# Patient Record
Sex: Female | Born: 1955 | Race: White | Hispanic: No | Marital: Married | Smoking: Former smoker
Health system: Southern US, Community
[De-identification: ages and names within clinical notes are randomized; demographics above are authoritative.]

## PROBLEM LIST (undated history)

## (undated) DIAGNOSIS — I471 Supraventricular tachycardia, unspecified: Secondary | ICD-10-CM

## (undated) DIAGNOSIS — R918 Other nonspecific abnormal finding of lung field: Secondary | ICD-10-CM

## (undated) DIAGNOSIS — M8580 Other specified disorders of bone density and structure, unspecified site: Secondary | ICD-10-CM

## (undated) DIAGNOSIS — E039 Hypothyroidism, unspecified: Secondary | ICD-10-CM

## (undated) DIAGNOSIS — R7301 Impaired fasting glucose: Secondary | ICD-10-CM

## (undated) DIAGNOSIS — R000 Tachycardia, unspecified: Secondary | ICD-10-CM

## (undated) DIAGNOSIS — E7800 Pure hypercholesterolemia, unspecified: Secondary | ICD-10-CM

## (undated) DIAGNOSIS — E785 Hyperlipidemia, unspecified: Secondary | ICD-10-CM

## (undated) HISTORY — DX: Other nonspecific abnormal finding of lung field: R91.8

## (undated) HISTORY — DX: Other specified disorders of bone density and structure, unspecified site: M85.80

## (undated) HISTORY — DX: Hyperlipidemia, unspecified: E78.5

## (undated) HISTORY — DX: Pure hypercholesterolemia, unspecified: E78.00

## (undated) HISTORY — DX: Supraventricular tachycardia: I47.1

## (undated) HISTORY — DX: Impaired fasting glucose: R73.01

## (undated) HISTORY — DX: Supraventricular tachycardia, unspecified: I47.10

## (undated) HISTORY — DX: Tachycardia, unspecified: R00.0

## (undated) HISTORY — DX: Hypothyroidism, unspecified: E03.9

---

## 1999-05-07 ENCOUNTER — Other Ambulatory Visit: Admission: RE | Admit: 1999-05-07 | Discharge: 1999-05-07 | Payer: Self-pay

## 2001-05-23 ENCOUNTER — Other Ambulatory Visit: Admission: RE | Admit: 2001-05-23 | Discharge: 2001-05-23 | Payer: Self-pay | Admitting: Internal Medicine

## 2002-10-16 ENCOUNTER — Other Ambulatory Visit: Admission: RE | Admit: 2002-10-16 | Discharge: 2002-10-16 | Payer: Self-pay | Admitting: Internal Medicine

## 2005-03-19 ENCOUNTER — Other Ambulatory Visit: Admission: RE | Admit: 2005-03-19 | Discharge: 2005-03-19 | Payer: Self-pay | Admitting: Internal Medicine

## 2005-03-23 ENCOUNTER — Ambulatory Visit (HOSPITAL_COMMUNITY): Admission: RE | Admit: 2005-03-23 | Discharge: 2005-03-23 | Payer: Self-pay | Admitting: Internal Medicine

## 2006-10-04 ENCOUNTER — Other Ambulatory Visit: Admission: RE | Admit: 2006-10-04 | Discharge: 2006-10-04 | Payer: Self-pay | Admitting: Obstetrics and Gynecology

## 2007-12-21 ENCOUNTER — Encounter: Admission: RE | Admit: 2007-12-21 | Discharge: 2007-12-21 | Payer: Self-pay | Admitting: Obstetrics and Gynecology

## 2008-05-14 ENCOUNTER — Encounter: Admission: RE | Admit: 2008-05-14 | Discharge: 2008-05-14 | Payer: Self-pay | Admitting: Obstetrics and Gynecology

## 2008-11-11 ENCOUNTER — Other Ambulatory Visit: Admission: RE | Admit: 2008-11-11 | Discharge: 2008-11-11 | Payer: Self-pay | Admitting: Obstetrics and Gynecology

## 2009-11-26 ENCOUNTER — Encounter: Admission: RE | Admit: 2009-11-26 | Discharge: 2009-11-26 | Payer: Self-pay | Admitting: Obstetrics and Gynecology

## 2010-08-16 ENCOUNTER — Encounter: Payer: Self-pay | Admitting: Obstetrics and Gynecology

## 2011-11-16 ENCOUNTER — Encounter: Payer: Self-pay | Admitting: *Deleted

## 2012-11-23 ENCOUNTER — Ambulatory Visit (INDEPENDENT_AMBULATORY_CARE_PROVIDER_SITE_OTHER): Payer: BC Managed Care – PPO | Admitting: Cardiovascular Disease

## 2012-11-23 ENCOUNTER — Encounter: Payer: Self-pay | Admitting: Cardiovascular Disease

## 2012-11-23 VITALS — BP 125/75 | HR 76 | Ht 63.5 in | Wt 120.0 lb

## 2012-11-23 DIAGNOSIS — I7300 Raynaud's syndrome without gangrene: Secondary | ICD-10-CM

## 2012-11-23 DIAGNOSIS — R002 Palpitations: Secondary | ICD-10-CM

## 2012-11-23 DIAGNOSIS — Z8249 Family history of ischemic heart disease and other diseases of the circulatory system: Secondary | ICD-10-CM

## 2012-11-23 DIAGNOSIS — I471 Supraventricular tachycardia: Secondary | ICD-10-CM | POA: Insufficient documentation

## 2012-11-23 NOTE — Progress Notes (Signed)
     Adrienne Chambers Date of Birth  1956/01/30       [facility]    [place] [address]  [address] [place], [place]  [address]   [place], [place]  [address] [phone]     [phone]   Fax  [phone]    Fax [phone]  Problem List:   History of Present Illness:  Adrienne Chambers is doing well.   He has occasional palpitations.  She is still exercising some.  No chest pain. No dyspnea.   Current Outpatient Prescriptions on File Prior to Visit  Medication Sig Dispense Refill  . levothyroxine (SYNTHROID, LEVOTHROID) 88 MCG tablet Take 88 mcg by mouth daily.       No current facility-administered medications on file prior to visit.    Allergies  Allergen Reactions  . Sulfa Antibiotics     Past Medical History  Diagnosis Date  . Hypothyroidism   . Tachycardia     No past surgical history on file.  History  Smoking status  . Former Smoker  . Quit date: 07/26/1981  Smokeless tobacco  . Not on file    History  Alcohol Use  . 1.0 oz/week  . 2 drink(s) per week    Family History  Problem Relation Age of Onset  . Coronary artery disease    . Arrhythmia      Reviw of Systems:  Reviewed in the HPI.  All other systems are negative.  Physical Exam: Blood pressure 110/90, pulse 76, height 5' 3.5" (1.613 m), weight 120 lb (54.432 kg). General: Well developed, well nourished, in no acute distress.  Head: Normocephalic, atraumatic, sclera non-icteric, mucus membranes are moist,   Neck: Supple. Carotids are 2 + without bruits. No JVD   Lungs: Clear   Heart: RR with occasional premature beats, normal S1, S2  Abdomen: Soft, non-tender, non-distended with normal bowel sounds.  Msk:  Strength and tone are normal   Extremities: No clubbing or cyanosis. No edema.  Distal pedal pulses are 2+ and equal    Neuro: CN II - XII intact.  Alert and oriented X 3.   Psych:  Normal   ECG: May1, 2014:  Normal sinus  rhythm with occasional premature atrial contractions.  Assessment / Plan:

## 2012-11-23 NOTE — Assessment & Plan Note (Signed)
Adrienne Chambers is doing fairly well. I've seen her in the past several years ago for some palpitations. She is thought to have some premature atrial contractions.  She had several PACs on her EKG today.  Her TSH has been normal.  At this point I think that we should just follow her clinically. She has a history of Raynaud's phenomenon and I do not think that she would tolerate beta blockers. In addition, the beta blockers would potentially affect are replacement.  I reassured her that her mild palpitations are benign. We'll see her again in one year.

## 2012-11-23 NOTE — Patient Instructions (Addendum)
Your physician wants you to follow-up in: 1 year  You will receive a reminder letter in the mail two months in advance. If you don't receive a letter, please call our office to schedule the follow-up appointment.   Your physician recommends that you return for a FASTING lipid profile: 1 year   

## 2012-11-23 NOTE — Assessment & Plan Note (Signed)
She has a history of Raynaud's phenomenon which typically bothers her the winter. She seems to be stable at present.

## 2012-11-27 LAB — HEPATIC FUNCTION PANEL
AST: 29 U/L (ref 0–37)
Albumin: 4.5 g/dL (ref 3.5–5.2)
Total Protein: 7.4 g/dL (ref 6.0–8.3)

## 2012-11-27 LAB — LIPID PANEL: Total CHOL/HDL Ratio: 3

## 2012-11-27 LAB — LDL CHOLESTEROL, DIRECT: Direct LDL: 117.3 mg/dL

## 2012-11-27 LAB — BASIC METABOLIC PANEL
BUN: 19 mg/dL (ref 6–23)
Creatinine, Ser: 0.9 mg/dL (ref 0.4–1.2)

## 2013-06-29 ENCOUNTER — Telehealth: Payer: Self-pay | Admitting: Cardiovascular Disease

## 2013-06-29 NOTE — Telephone Encounter (Signed)
Patient last seen in May, 2014. States that she continues to have intermittent episodes of "racing heart", usually happens with exercise but sometimes even at rest. Episodes last several minutes. She is requesting to be proactive and have "a more thorough diagnostic workup", as soon as possible, due to family cardiac history and persistent concerns related to heart rate episodes. Routed to Dr. Garfield Cornea, RN.

## 2013-06-29 NOTE — Telephone Encounter (Signed)
New Problem:  Pt states she is wanting some cardiology tests. Pt states her father died at 28 from heart disease. Pt states her mom had open heart surgery.  Pt has been told she has an irregular heart beat and wants to know if she should have a stress test, echo, or other speciality tests.

## 2013-07-02 ENCOUNTER — Ambulatory Visit (INDEPENDENT_AMBULATORY_CARE_PROVIDER_SITE_OTHER): Payer: BC Managed Care – PPO | Admitting: Cardiovascular Disease

## 2013-07-02 ENCOUNTER — Encounter: Payer: Self-pay | Admitting: Cardiovascular Disease

## 2013-07-02 VITALS — BP 108/76 | HR 62 | Ht 63.5 in | Wt 120.0 lb

## 2013-07-02 DIAGNOSIS — R002 Palpitations: Secondary | ICD-10-CM

## 2013-07-02 NOTE — Patient Instructions (Addendum)
We did find that she had premature atrial contractions on your  EKG.  These are benign arrhythmias that come from the upper chamber of the heart. They're not serious.  Some of her palpitations may be runs of atrial tachycardia. It would be unlikely for you to have atrial fibrillation since that would last for 5-10 minutes.  Even if we did find that you had brief episodes of atrial fibrillation he risk of stroke is incredibly low because you do not have any of the associated risk factors  (congestive heart failure, hypertension, age over 79, diabetes, stroke)  Your thyroid replacement may also be part of the problem.  You should take the medicine at the same time each day and without food ( or as directed by your medical doctor)    If you have a sustained tachycardia, try the valsalva maneuver or diving reflex ( cold water bottles to the forehead)   Your physician recommends that you schedule a follow-up appointment in: 3 months in [facility]   Your physician has requested that you have an echocardiogram. Echocardiography is a painless test that uses sound waves to create images of your heart. It provides your doctor with information about the size and shape of your heart and how well your heart's chambers and valves are working. This procedure takes approximately one hour. There are no restrictions for this procedure.   30 Day Cardiac Monitor

## 2013-07-02 NOTE — Progress Notes (Signed)
    [name] Date of Birth  03-Jul-1956       [facility]    [place] [address]  [address] [place], [place]  [address]   [place], [place]  [address] [phone]     [phone]   Fax  [phone]    Fax [phone]  Problem List: 1. Premature atrial contractions 2. Hypothyroidism   History of Present Illness:  Yuko is doing well.   He has occasional palpitations.  She is still exercising some.  No chest pain. No dyspnea.  Dec. 8, 2014;  Saide is seen again for follow up of her palpitations.  She has had palpitations for years - father died of complications of A-Fib.   These palpitations are fairly rare - may occur while she is playing tennis.   The arrhythmia is usually a rapid, regular - last 15 seconds to 2 minutes.   These palpitations do not prevent her from doing her normal activities.   These episodes almost always occur in the summer. She's had several episodes while playing tennis out in the hot sun.     Current Outpatient Prescriptions on File Prior to Visit  Medication Sig Dispense Refill  . aspirin 81 MG tablet Take 81 mg by mouth daily. Take one tab daily as needed      . levothyroxine (SYNTHROID, LEVOTHROID) 88 MCG tablet Take 88 mcg by mouth daily.       No current facility-administered medications on file prior to visit.   she takes her Synthroid 88 mcg alternating with 75 mcg every other day.  Allergies  Allergen Reactions  . Sulfa Antibiotics     Past Medical History  Diagnosis Date  . Hypothyroidism   . Tachycardia     History reviewed. No pertinent past surgical history.  History  Smoking status  . Former Smoker  . Quit date: 07/26/1981  Smokeless tobacco  . Not on file    History  Alcohol Use  . 1.0 oz/week  . 2 drink(s) per week    Family History  Problem Relation Age of Onset  . Coronary artery disease    . Arrhythmia    . Heart disease Mother   . Coronary artery  disease Mother   . Arrhythmia Father   . Heart attack Father     Reviw of Systems:  Reviewed in the HPI.  All other systems are negative.  Physical Exam: Blood pressure 108/76, pulse 62, height 5' 3.5" (1.613 m), weight 120 lb (54.432 kg). General: Well developed, well nourished, in no acute distress.  Head: Normocephalic, atraumatic, sclera non-icteric, mucus membranes are moist,   Neck: Supple. Carotids are 2 + without bruits. No JVD   Lungs: Clear   Heart: RR with occasional premature beats, normal S1, S2  Abdomen: Soft, non-tender, non-distended with normal bowel sounds.  Msk:  Strength and tone are normal   Extremities: No clubbing or cyanosis. No edema.  Distal pedal pulses are 2+ and equal    Neuro: CN II - XII intact.  Alert and oriented X 3.   Psych:  Normal   ECG: May1, 2014:  Normal sinus rhythm with occasional premature atrial contractions.  Assessment / Plan:

## 2013-07-02 NOTE — Assessment & Plan Note (Addendum)
Adrienne Chambers  presents for further evaluation of her palpitations. Her palpitations have sounded rather benign over the years. They last anywhere from 30 seconds to 5-10 minutes. They definitely do not last for several hours. She occasionally has some dizziness. It's possible that she's having some atrial tachycardia or perhaps may have supraventricular tachycardia.   I've instructed her in the Valsalva maneuver.  We'll get an echocardiogram because of her palpitations and abnormal heart sounds. We'll place a 30 day event monitor. I've instructed her to try to take her Synthroid at the same time every day and without food as instructed by her medical doctor.  See her in 3 months.

## 2013-07-03 ENCOUNTER — Telehealth (INDEPENDENT_AMBULATORY_CARE_PROVIDER_SITE_OTHER): Payer: Self-pay

## 2013-07-03 NOTE — Telephone Encounter (Signed)
LMOV pt to call for appt information.  Dr. Abbey Chatters on 08/01/13 at 11:10a.m.  Please instruct the patient to arrive 30 minutes prior.  Pt referred by Dr. Eden Emms for umbilical hernia.

## 2013-07-04 ENCOUNTER — Telehealth: Payer: Self-pay | Admitting: *Deleted

## 2013-07-04 DIAGNOSIS — K429 Umbilical hernia without obstruction or gangrene: Secondary | ICD-10-CM

## 2013-07-04 NOTE — Telephone Encounter (Signed)
Message copied by Antony Odea on Wed Jul 04, 2013 11:27 AM ------      Message from: Vesta Mixer      Created: Tue Jul 03, 2013  5:44 AM       Emmit Alexanders has a small umbilical hernia and would like to see you  for evaluation.    It is a small hernia and likely is not going to cause any problems for a while but she would like to see if it should be fixed before it gets any larger.            Thanks,      Jones Broom,            Will you make a referral to Dr. Abbey Chatters in epic please.  Thank you ------

## 2013-07-04 NOTE — Telephone Encounter (Signed)
Order placed/ staff note sent to call pt with date and time.

## 2013-07-25 ENCOUNTER — Encounter: Payer: Self-pay | Admitting: Radiology

## 2013-07-25 ENCOUNTER — Encounter (INDEPENDENT_AMBULATORY_CARE_PROVIDER_SITE_OTHER): Payer: BC Managed Care – PPO

## 2013-07-25 ENCOUNTER — Other Ambulatory Visit (HOSPITAL_COMMUNITY): Payer: BC Managed Care – PPO

## 2013-07-25 ENCOUNTER — Ambulatory Visit (HOSPITAL_COMMUNITY): Payer: BC Managed Care – PPO | Attending: Cardiovascular Disease | Admitting: Radiology

## 2013-07-25 DIAGNOSIS — R002 Palpitations: Secondary | ICD-10-CM

## 2013-07-25 DIAGNOSIS — I079 Rheumatic tricuspid valve disease, unspecified: Secondary | ICD-10-CM | POA: Insufficient documentation

## 2013-07-25 DIAGNOSIS — I491 Atrial premature depolarization: Secondary | ICD-10-CM

## 2013-07-25 NOTE — Progress Notes (Signed)
Patient ID: Adrienne Chambers, female   DOB: 03-07-1956, 57 y.o.   MRN: [id number] E Cardio 30 day monitor applied

## 2013-07-25 NOTE — Progress Notes (Signed)
Echocardiogram performed.  

## 2013-08-01 ENCOUNTER — Ambulatory Visit (INDEPENDENT_AMBULATORY_CARE_PROVIDER_SITE_OTHER): Payer: BC Managed Care – PPO | Admitting: General Surgery

## 2013-08-23 ENCOUNTER — Telehealth: Payer: Self-pay | Admitting: Cardiovascular Disease

## 2013-08-23 ENCOUNTER — Telehealth: Payer: Self-pay

## 2013-08-23 NOTE — Telephone Encounter (Signed)
New problem   Pt stated she was waiting on a call from Dr Elease HashimotoNahser. Per pt the nurse stated he was going to call her and she haven't heard from him. And also need to talk to nurse concerning wearing her monitor. Please call pt.

## 2013-08-23 NOTE — Telephone Encounter (Signed)
Pt states she did not wear her monitor the whole time. States "it is an obnoxious machine, very problematic, and can't see how anyone can wear it". States a couple days ago she had a dizzy spell when she was playing tennis, states her dizzy spells only come when she is playing tennis. She thinks she may need to wear the monitor now, but not unless we have a smaller one. Please call and advise.

## 2013-08-23 NOTE — Telephone Encounter (Signed)
I sent a note to Dr Elease HashimotoNahser to call the pt as he noted on her echo report. I left pt a msg to call back regarding monitor question.

## 2013-08-27 ENCOUNTER — Ambulatory Visit (INDEPENDENT_AMBULATORY_CARE_PROVIDER_SITE_OTHER): Payer: BC Managed Care – PPO | Admitting: General Surgery

## 2013-08-27 ENCOUNTER — Encounter (INDEPENDENT_AMBULATORY_CARE_PROVIDER_SITE_OTHER): Payer: Self-pay

## 2013-08-27 ENCOUNTER — Encounter (INDEPENDENT_AMBULATORY_CARE_PROVIDER_SITE_OTHER): Payer: Self-pay | Admitting: General Surgery

## 2013-08-27 VITALS — BP 110/72 | HR 60 | Temp 98.0°F | Resp 14 | Ht 63.5 in | Wt 121.6 lb

## 2013-08-27 DIAGNOSIS — K429 Umbilical hernia without obstruction or gangrene: Secondary | ICD-10-CM

## 2013-08-27 NOTE — Patient Instructions (Signed)
Please come back and see us if the hernia becomes more uncomfortable or gets larger.

## 2013-08-27 NOTE — Progress Notes (Signed)
Patient ID: Adrienne Chambers, female   DOB: 09/18/1955, 58 y.o.   MRN: [id number]  Chief Complaint  Patient presents with  . New Evaluation    eval umb hernia    HPI Adrienne FrohlichBeverly A [place] is a 58 y.o. female.   HPI  She is referred by Dr. Kristeen MissPhilip Nahser because of an umbilical hernia. She states this has been present for about 2-3 years. She gets occasional "twinge" of discomfort. No major discomfort. It has not changed in size. She is very active.  Past Medical History  Diagnosis Date  . Hypothyroidism   . Tachycardia     History reviewed. No pertinent past surgical history.  Family History  Problem Relation Age of Onset  . Coronary artery disease    . Arrhythmia    . Heart disease Mother   . Coronary artery disease Mother   . Arrhythmia Father   . Heart attack Father     Social History History  Substance Use Topics  . Smoking status: Former Smoker    Quit date: 07/26/1981  . Smokeless tobacco: Never Used  . Alcohol Use: 1.0 oz/week    2 drink(s) per week    Allergies  Allergen Reactions  . Sulfa Antibiotics     Current Outpatient Prescriptions  Medication Sig Dispense Refill  . aspirin 81 MG tablet Take 81 mg by mouth daily. Take one tab daily as needed      . Calcium Carbonate (CALTRATE 600 PO) Take 600 mg by mouth daily.      Marland Kitchen. levothyroxine (SYNTHROID, LEVOTHROID) 75 MCG tablet Take 75 mcg by mouth every other day.      . levothyroxine (SYNTHROID, LEVOTHROID) 88 MCG tablet Take 88 mcg by mouth daily.       No current facility-administered medications for this visit.    Review of Systems Review of Systems  Constitutional: Negative.   Respiratory: Negative.   Cardiovascular: Negative.   Gastrointestinal: Negative.   Genitourinary: Negative.     Blood pressure 110/72, pulse 60, temperature 98 F (36.7 C), temperature source Oral, resp. rate 14, height 5' 3.5" (1.613 m), weight 121 lb 9.6 oz (55.157 kg).  Physical Exam Physical Exam  Constitutional: She  appears well-developed and well-nourished. No distress.  HENT:  Head: Normocephalic and atraumatic.  Abdominal: Soft. She exhibits no distension. There is no tenderness.  There is a umbilical bulge that is not completely reducible. There is minimal tenderness. No erythema.  Neurological: She is alert.  Skin: Skin is warm and dry.  Psychiatric: She has a normal mood and affect. Her behavior is normal.    Data Reviewed Dr. Harvie BridgeNahser's office visit  Assessment    Umbilical hernia that has not changed and is essentially asymptomatic. We talked about options of repair versus expectant management.     Plan    Expectant management for now. If the hernia becomes more symptomatic or larger, I recommended she return and we discuss repair.  No activity restrictions.       Pattrick Bady J 08/27/2013, 2:48 PM

## 2013-08-29 ENCOUNTER — Telehealth: Payer: Self-pay | Admitting: *Deleted

## 2013-08-29 NOTE — Telephone Encounter (Signed)
Left voicemail letting patient know that per Dr. Elease HashimotoNahser his nurse Michael LitterJodette will be giving her a call about the monitor.

## 2013-08-29 NOTE — Telephone Encounter (Signed)
Pt has had dizzy spells // should she wear heart monitor again. Plus I reviewed echo with her you stated on there you will call to discuss. Pt is very interested in preventative care/ calcium screening due to pts family hx of early heart disease. Please call her.

## 2013-08-29 NOTE — Telephone Encounter (Signed)
Left msg stating ecardio readings were normal and to call with further questions or concerns.

## 2013-08-30 NOTE — Telephone Encounter (Signed)
Dr Elease HashimotoNahser left msg with pt.

## 2013-08-31 ENCOUNTER — Other Ambulatory Visit: Payer: Self-pay | Admitting: *Deleted

## 2013-08-31 DIAGNOSIS — R002 Palpitations: Secondary | ICD-10-CM

## 2013-08-31 NOTE — Progress Notes (Unsigned)
Pt wore a heart monitor for a couple days then removed it stating it was too bulky and hard to perform her daily activities. Per Dr Elease HashimotoNahser, she is to have a Zio Patch monitor. Spoke with Florentina AddisonKatie //monitor room and she will see if we can get one for her.

## 2013-09-28 ENCOUNTER — Encounter: Payer: Self-pay | Admitting: Radiology

## 2013-09-28 ENCOUNTER — Encounter (INDEPENDENT_AMBULATORY_CARE_PROVIDER_SITE_OTHER): Payer: BC Managed Care – PPO

## 2013-09-28 DIAGNOSIS — R002 Palpitations: Secondary | ICD-10-CM

## 2013-09-28 DIAGNOSIS — R439 Unspecified disturbances of smell and taste: Secondary | ICD-10-CM

## 2013-09-28 NOTE — Progress Notes (Signed)
Patient ID: Adrienne Chambers, female   DOB: July 07, 1956, 58 y.o.   MRN: [id number] 14 Day Zio patch applied

## 2013-10-26 ENCOUNTER — Telehealth: Payer: Self-pay | Admitting: Cardiovascular Disease

## 2013-10-26 NOTE — Telephone Encounter (Signed)
Adrienne SpragueBeverly has been wearing the Zio XT patch monitor. She was found to have episodes of supraventricular tachycardia up to rates of 179. 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  We discussed this possibility on previous office visits. Her heart rate and blood pressure run low at baseline so we have not started her on any beta blockers.  She was not at home tonight when I called her. I've left a message on her phone. She's to try Valsalva maneuver. She can also try placing ice cold rag up to her for and or neck.  She'll call me next week for followup visit. We'll discuss options at that time. She may wish to have ablation of this SVT.  Adrienne MixerPhilip J. Nahser, Montez HagemanJr., MD, [facility] 10/26/2013, 6:16 PM Office - [phone] Pager [phone]

## 2013-10-29 ENCOUNTER — Telehealth: Payer: Self-pay | Admitting: *Deleted

## 2013-10-29 NOTE — Telephone Encounter (Signed)
Spoke with family member, [place]10[place]13.  I was calling to make an app for her/// ZIO XT monitor results showed SVT@179BPM . Per Dr Elease HashimotoNahser wants her to have an app as soon as can, Dr Elease HashimotoNahser left msg for her last week also. Pt to have an app when available.

## 2013-11-12 NOTE — Telephone Encounter (Signed)
Left message for patient to call office to schedule an appointment.

## 2013-11-20 NOTE — Telephone Encounter (Signed)
Left message for patient to call office regarding possible appointment tomorrow

## 2013-11-20 NOTE — Telephone Encounter (Signed)
Patient scheduled to see Dr. Elease HashimotoNahser 4/29

## 2013-11-21 ENCOUNTER — Encounter: Payer: Self-pay | Admitting: Cardiovascular Disease

## 2013-11-21 ENCOUNTER — Ambulatory Visit (INDEPENDENT_AMBULATORY_CARE_PROVIDER_SITE_OTHER): Payer: BC Managed Care – PPO | Admitting: Cardiovascular Disease

## 2013-11-21 VITALS — BP 120/75 | HR 52 | Ht 63.5 in | Wt 120.4 lb

## 2013-11-21 DIAGNOSIS — I498 Other specified cardiac arrhythmias: Secondary | ICD-10-CM

## 2013-11-21 DIAGNOSIS — I471 Supraventricular tachycardia: Secondary | ICD-10-CM

## 2013-11-21 NOTE — Assessment & Plan Note (Signed)
Adrienne Chambers patch monitor and was found to have episodes of supraventricular tachycardia. He reported one episode of SVT at 179 beats a minute and occurred at 3:30 in the morning.  She's had palpitations for the past many years. These are minimally symptomatic. She has a slight dizziness but has never had syncope or presyncope. She denies any chest pain or shortness breath.  Her heart rate is very slow at rest. We performed a 1 minutes that his peak she increase her heart rate from 60 to 92 without any difficulty.  I instructed her in the Valsalva maneuver.  WE  also talked about the diving reflex.  We discussed the fact that she may need RF ablation for SVT if she feels this conservative therapy. Her heart rate at baseline is quite low so  we cannot add a beta blocker or calcium channel blocker.

## 2013-11-21 NOTE — Patient Instructions (Addendum)
I think you have supraventricular tachycardia. This is clearly different from ventricular tachycardia.      Try the Valsalva maneuver.   Also try stimulation of the diving reflex.    Your physician recommends that you continue on your current medications as directed. Please refer to the Current Medication list given to you today.  Your physician wants you to follow-up in: 1 year with Dr. Elease HashimotoNahser.  You will receive a reminder letter in the mail two months in advance. If you don't receive a letter, please call our office to schedule the follow-up appointment.

## 2013-11-21 NOTE — Progress Notes (Signed)
Adrienne FrohlichBeverly A Chambers Date of Birth  06-27-1956       [facility]    [place] [facility] [address]  [address] [place], [place]  [id number]   [place], [place]  [id number] [phone]     [phone]   Fax  [phone]    Fax [phone]  Problem List: 1. Premature atrial contractions 2. Hypothyroidism   History of Present Illness:  Adrienne Chambers is doing well.   He has occasional palpitations.  She is still exercising some.  No chest pain. No dyspnea.  Dec. 8, 2014;  Adrienne Chambers is seen again for follow up of her palpitations.  She has had palpitations for years - father died of complications of A-Fib.   These palpitations are fairly rare - may occur while she is playing tennis.   The arrhythmia is usually a rapid, regular - last 15 seconds to 2 minutes.   These palpitations do not prevent her from doing her normal activities.   These episodes almost always occur in the summer. She's had several episodes while playing tennis out in the hot sun.    November 21, 2013:  Adrienne Chambers has been wearing a patch monitor. Her son have episodes of supraventricular tachycardia.    Current Outpatient Prescriptions on File Prior to Visit  Medication Sig Dispense Refill  . aspirin 81 MG tablet Take 81 mg by mouth daily. Take one tab daily as needed      . Calcium Carbonate (CALTRATE 600 PO) Take 600 mg by mouth daily.      Marland Kitchen. levothyroxine (SYNTHROID, LEVOTHROID) 75 MCG tablet Take 75 mcg by mouth every other day.      . levothyroxine (SYNTHROID, LEVOTHROID) 88 MCG tablet Take 88 mcg by mouth daily.       No current facility-administered medications on file prior to visit.   she takes her Synthroid 88 mcg alternating with 75 mcg every other day.  Allergies  Allergen Reactions  . Sulfa Antibiotics     Past Medical History  Diagnosis Date  . Hypothyroidism   . Tachycardia     No past surgical history on file.  History  Smoking status  . Former Smoker  .  Quit date: 07/26/1981  Smokeless tobacco  . Never Used    History  Alcohol Use  . 1.0 oz/week  . 2 drink(s) per week    Family History  Problem Relation Age of Onset  . Coronary artery disease    . Arrhythmia    . Heart disease Mother   . Coronary artery disease Mother   . Arrhythmia Father   . Heart attack Father     Reviw of Systems:  Reviewed in the HPI.  All other systems are negative.  Physical Exam: Blood pressure 120/75, pulse 52, height 5' 3.5" (1.613 m), weight 120 lb 6.4 oz (54.613 kg). General: Well developed, well nourished, in no acute distress.  Head: Normocephalic, atraumatic, sclera non-icteric, mucus membranes are moist,   Neck: Supple. Carotids are 2 + without bruits. No JVD   Lungs: Clear   Heart: RR with occasional premature beats, normal S1, S2  Abdomen: Soft, non-tender, non-distended with normal bowel sounds.  Msk:  Strength and tone are normal   Extremities: No clubbing or cyanosis. No edema.  Distal pedal pulses are 2+ and equal    Neuro: CN II - XII intact.  Alert and oriented X 3.   Psych:  Normal   ECG: May1,  2014:  Normal sinus rhythm with occasional premature atrial contractions.  Assessment / Plan:

## 2013-12-14 ENCOUNTER — Telehealth: Payer: Self-pay | Admitting: Cardiovascular Disease

## 2013-12-14 DIAGNOSIS — Z79899 Other long term (current) drug therapy: Secondary | ICD-10-CM

## 2013-12-14 DIAGNOSIS — E7800 Pure hypercholesterolemia, unspecified: Secondary | ICD-10-CM

## 2013-12-14 DIAGNOSIS — I471 Supraventricular tachycardia: Secondary | ICD-10-CM

## 2013-12-14 NOTE — Telephone Encounter (Signed)
Pt is having fasting labs on 12/19/13 she would like to add to that vitamin D, because she is having an procedure done  at her GYN and the GYN have asked to have vit D check. Vit D was added. Pt is aware.

## 2013-12-14 NOTE — Telephone Encounter (Signed)
New problem   Pt want to add Vitamin D to her lab order.

## 2013-12-19 ENCOUNTER — Other Ambulatory Visit: Payer: BC Managed Care – PPO

## 2013-12-26 ENCOUNTER — Ambulatory Visit: Payer: BC Managed Care – PPO | Admitting: Cardiovascular Disease

## 2013-12-26 ENCOUNTER — Other Ambulatory Visit: Payer: BC Managed Care – PPO

## 2015-05-28 ENCOUNTER — Encounter: Payer: Self-pay | Admitting: *Deleted

## 2015-06-02 ENCOUNTER — Ambulatory Visit (INDEPENDENT_AMBULATORY_CARE_PROVIDER_SITE_OTHER): Payer: BLUE CROSS/BLUE SHIELD | Admitting: Cardiovascular Disease

## 2015-06-02 ENCOUNTER — Encounter: Payer: Self-pay | Admitting: Cardiovascular Disease

## 2015-06-02 VITALS — BP 124/70 | HR 62 | Ht 63.5 in | Wt 120.4 lb

## 2015-06-02 DIAGNOSIS — I471 Supraventricular tachycardia, unspecified: Secondary | ICD-10-CM

## 2015-06-02 DIAGNOSIS — E785 Hyperlipidemia, unspecified: Secondary | ICD-10-CM | POA: Diagnosis not present

## 2015-06-02 DIAGNOSIS — Z139 Encounter for screening, unspecified: Secondary | ICD-10-CM | POA: Diagnosis not present

## 2015-06-02 NOTE — Progress Notes (Signed)
Lelon FrohlichBeverly A Veras Date of Birth  25-Oct-1955       [facility]    [place] [facility] [address], [address]  [address], [address] [place], [place]  [address]   [place], [place]  [address] [phone]     [phone]   Fax  [phone]    Fax [phone]  Problem List: 1. Premature atrial contractions 2. Hypothyroidism   History of Present Illness:  Adrienne Chambers is doing well.   He has occasional palpitations.  She is still exercising some.  No chest pain. No dyspnea.  Dec. 8, 2014;  Adrienne Chambers is seen again for follow up of her palpitations.  She has had palpitations for years - father died of complications of A-Fib.   These palpitations are fairly rare - may occur while she is playing tennis.   The arrhythmia is usually a rapid, regular - last 15 seconds to 2 minutes.   These palpitations do not prevent her from doing her normal activities.   These episodes almost always occur in the summer. She's had several episodes while playing tennis out in the hot sun.    November 21, 2013:  Adrienne Chambers has been wearing a patch monitor. Her son have episodes of supraventricular tachycardia.    Nov. 7, 2016:  Adrienne Chambers is doing ok Has had some dizziness while playing tennis. At other times, she feels well.   Dizziness lasts for about 10 minutes.   No significant palpitations .   Has resolved since her mother passed away .  Has had documented SVT lasting for about a minute.   Current Outpatient Prescriptions on File Prior to Visit  Medication Sig Dispense Refill  . aspirin 81 MG tablet Take 81 mg by mouth daily. Take one tab daily as needed    . Calcium Carbonate (CALTRATE 600 PO) Take 600 mg by mouth daily.    Marland Kitchen. levothyroxine (SYNTHROID, LEVOTHROID) 88 MCG tablet Take 88 mcg by mouth daily.     No current facility-administered medications on file prior to visit.   she takes her Synthroid 88 mcg alternating with 75 mcg every other day.  Allergies  Allergen Reactions  .  Sulfa Antibiotics     Past Medical History  Diagnosis Date  . Hypothyroidism   . Tachycardia     No past surgical history on file.  History  Smoking status  . Former Smoker  . Quit date: 07/26/1981  Smokeless tobacco  . Never Used    History  Alcohol Use  . 1.0 oz/week  . 2 drink(s) per week    Family History  Problem Relation Age of Onset  . Heart disease Mother     valve repair  . Coronary artery disease Mother     CABG  . Arrhythmia Father   . Heart attack Father   . Atrial fibrillation Father   . Heart murmur Mother     Reviw of Systems:  Reviewed in the HPI.  All other systems are negative.  Physical Exam: Blood pressure 124/70, pulse 62, height 5' 3.5" (1.613 m), weight 120 lb 6.4 oz (54.613 kg). General: Well developed, well nourished, in no acute distress.  Head: Normocephalic, atraumatic, sclera non-icteric, mucus membranes are moist,   Neck: Supple. Carotids are 2 + without bruits. No JVD   Lungs: Clear   Heart: RR with occasional premature beats, normal S1, S2  Abdomen: Soft, non-tender, non-distended with normal bowel sounds.  Msk:  Strength and tone are normal  Extremities: No clubbing or cyanosis. No edema.  Distal pedal pulses are 2+ and equal    Neuro: CN II - XII intact.  Alert and oriented X 3.   Psych:  Normal   ECG: Nov. 7, 2016:  Sinus rhythm at 62 , PACs.    Assessment / Plan:     1. Supraventricular tachycardia: She's continues to have symptoms of palpitations that last about 5 minutes. It's likely that she has SVT. We demonstrated SVT on her several years ago.  At this point the episodes do not seem to bother her too much and her resting heart rate is fairly slow. I do not think that she'll tolerate a beta blocker Berry well. We'll continue to observe. We discussed doing an RF ablation if the SVT becomes a problem.   2. Family history of coronary artery disease: her father died at age 57 of sudden cardiac death in her  mother had a history of coronary artery bypass grafting in her 57s. Alyssa has a history of moderate hyperlipidemia. We'll get a coronary calcium score for further risk assessment.   3. Hyperlipidemia: She's currently watching her diet and she also is taking Red Yeast Rice. She'll be Getting her lipids checked in December. She'll forward  these results to me.   I'll see her again in one year.  Nahser, Deloris Ping, MD  06/02/2015 5:07 PM    [facility] [address] [place],  [address] [place], [place]  [address] Pager [phone] Phone: [phone]; Fax: [phone]   [facility]  [address] [place], [place]  [address] [phone]   Fax [phone]

## 2015-06-02 NOTE — Patient Instructions (Addendum)
Medication Instructions:  Your physician recommends that you continue on your current medications as directed. Please refer to the Current Medication list given to you today.   Labwork: None ordered  Testing/Procedures: Your physician recommends your have a Coronary Calcium Score  Follow-Up: Your physician wants you to follow-up in: 1 year with Dr.Norbert Malkin You will receive a reminder letter in the mail two months in advance. If you don't receive a letter, please call our office to schedule the follow-up appointment.   Any Other Special Instructions Will Be Listed Below (If Applicable). Increase your intake of fluids (water with electrolyte tabs like Nun tablets, or gatorade) , protein ( hard boiled eggs, chicken, fish) , and a electrolytes ( V-8 juice, salt, potassium chloride  which is sold as No-Salt      If you need a refill on your cardiac medications before your next appointment, please call your pharmacy.

## 2015-06-12 ENCOUNTER — Ambulatory Visit (INDEPENDENT_AMBULATORY_CARE_PROVIDER_SITE_OTHER)
Admission: RE | Admit: 2015-06-12 | Discharge: 2015-06-12 | Disposition: A | Discharge status: Home / Self Care | Payer: Self-pay | Source: Ambulatory Visit | Attending: Cardiovascular Disease | Admitting: Cardiovascular Disease

## 2015-06-12 ENCOUNTER — Telehealth: Payer: Self-pay

## 2015-06-12 DIAGNOSIS — Z139 Encounter for screening, unspecified: Secondary | ICD-10-CM

## 2015-06-12 NOTE — Telephone Encounter (Signed)
[facility] calling about a CT score result -showing 5 mm left lower lobe pulmonary nodule. They will be sending a fax over to our office of the results. Will forward to Dr. Elease HashimotoNahser and Marcelino DusterMichelle, his nurse.

## 2015-06-13 ENCOUNTER — Telehealth: Payer: Self-pay | Admitting: Nurse Practitioner

## 2015-06-13 NOTE — Telephone Encounter (Addendum)
Cassandra with [facility] calling about a CT score result -showing 5 mm left lower lobe pulmonary nodule. They will be sending a fax over to our office of the results. Will forward to Dr. Elease HashimotoNahser and Marcelino DusterMichelle, his nurse  Dr. Elease HashimotoNahser called and left message for patient to call the office to discuss results

## 2015-06-17 NOTE — Telephone Encounter (Signed)
Spoke with patient to make certain she received Dr. Harvie BridgeNahser's message and did not have any further questions.  I answered her questions about SVT, calcium score and hyperlipidemia. I advised patient that one year follow-up is recommended unless she has questions or concerns prior to that time.  She verbalized understanding and agreement.

## 2015-08-19 LAB — TSH: TSH: 0.26 u[IU]/mL — AB (ref 0.41–5.90)

## 2015-09-08 ENCOUNTER — Encounter: Payer: Self-pay | Admitting: Internal Medicine

## 2015-09-16 ENCOUNTER — Encounter: Payer: Self-pay | Admitting: Family Medicine

## 2015-09-16 ENCOUNTER — Other Ambulatory Visit (INDEPENDENT_AMBULATORY_CARE_PROVIDER_SITE_OTHER): Payer: BLUE CROSS/BLUE SHIELD

## 2015-09-16 ENCOUNTER — Ambulatory Visit (INDEPENDENT_AMBULATORY_CARE_PROVIDER_SITE_OTHER): Payer: BLUE CROSS/BLUE SHIELD | Admitting: Family Medicine

## 2015-09-16 VITALS — BP 98/72 | HR 68 | Ht 63.5 in | Wt 117.0 lb

## 2015-09-16 DIAGNOSIS — M25512 Pain in left shoulder: Secondary | ICD-10-CM | POA: Diagnosis not present

## 2015-09-16 DIAGNOSIS — M25511 Pain in right shoulder: Secondary | ICD-10-CM

## 2015-09-16 DIAGNOSIS — M7551 Bursitis of right shoulder: Secondary | ICD-10-CM | POA: Diagnosis not present

## 2015-09-16 NOTE — Progress Notes (Signed)
[facility] [address] [place], [place] [address] Phone: [phone] Subjective:    I'm seeing this patient by the request  of:  Irving Copas, MD   CC: right greater than left shoulder pain  [name] [name] is a 60 y.o. female coming in with complaint of recurrent left shoulder pain. Patient states that going on for multiple months. Seems to be increasing slowly. Patient does not remember any true injury. Tries to be active. Patient tries to play tennis fairly regularly. Does not seem to give her too much trouble with it. Denies any radiation down the hand. States that it's more of a dull throbbing aching pain. Has responded somewhat to a topical anti-inflammatory she got from her husband. Rates the severity of pain a 6 out of 10. Not stopping her from daily activities and denies any loss of range of motion. No weakness.     Past Medical History  Diagnosis Date  . Hypothyroidism   . Tachycardia   . Supraventricular tachycardia (HCC)   . Hyperlipidemia   . Hypercholesterolemia   . Osteopenia   . Abnormal CT scan, lung   . Impaired fasting glucose    No past surgical history on file. Social History   Social History  . Marital Status: Married    Spouse Name: N/A  . Number of Children: 3  . Years of Education: N/A   Occupational History  . interior design    Social History Main Topics  . Smoking status: Former Smoker    Quit date: 07/26/1981  . Smokeless tobacco: Never Used  . Alcohol Use: 1.0 oz/week    2 drink(s) per week  . Drug Use: None  . Sexual Activity: Not Asked   Other Topics Concern  . None   Social History Narrative   Allergies  Allergen Reactions  . Sulfa Antibiotics    Family History  Problem Relation Age of Onset  . Heart disease Mother     valve repair  . Coronary artery disease Mother     CABG  . Heart murmur Mother   . Arrhythmia Father   . Heart attack Father   . Atrial  fibrillation Father   . Heart disease Father     Past medical history, social, surgical and family history all reviewed in electronic medical record.  No pertanent information unless stated regarding to the chief complaint.   Review of Systems: No headache, visual changes, nausea, vomiting, diarrhea, constipation, dizziness, abdominal pain, skin rash, fevers, chills, night sweats, weight loss, swollen lymph nodes, body aches, joint swelling, muscle aches, chest pain, shortness of breath, mood changes.   Objective Blood pressure 98/72, pulse 68, height 5' 3.5" (1.613 m), weight 117 lb (53.071 kg), SpO2 97 %.  General: No apparent distress alert and oriented x3 mood and affect normal, dressed appropriately.  HEENT: Pupils equal, extraocular movements intact  Respiratory: Patient's speak in full sentences and does not appear short of breath  Cardiovascular: No lower extremity edema, non tender, no erythema  Skin: Warm dry intact with no signs of infection or rash on extremities or on axial skeleton.  Abdomen: Soft nontender  Neuro: Cranial nerves II through XII are intact, neurovascularly intact in all extremities with 2+ DTRs and 2+ pulses.  Lymph: No lymphadenopathy of posterior or anterior cervical chain or axillae bilaterally.  Gait normal with good balance and coordination.  MSK:  Non tender with full range of motion and good stability and symmetric strength  and tone of , elbows, wrist, hip, knee and ankles bilaterally.   Neck: Inspection unremarkable. No palpable stepoffs. Negative Spurling's maneuver. Full neck range of motion Grip strength and sensation normal in bilateral hands Strength good C4 to T1 distribution No sensory change to C4 to T1 Negative Hoffman sign bilaterally Reflexes normal  Shoulder: Right Inspection reveals no abnormalities, atrophy or asymmetry. Palpation is normal with no tenderness over AC joint or bicipital groove. ROM is full in all planes  passively. Rotator cuff strength normal throughout. signs of impingement with positive Neer and Hawkin's tests, but negative empty can sign. Speeds and Yergason's tests normal. No labral pathology noted with negative Obrien's, negative clunk and good stability. Normal scapular function observed. No painful arc and no drop arm sign. No apprehension sign Contralateral knee has some mild impingement.  MSK US performed of: Right This study was ordered, performed, and interpreted by Terrilee Files D.O.  Shoulder:   Supraspinatus:  Appears normal on long and transverse views, Bursal bulge seen with shoulder abduction on impingement view. Infraspinatus:  Appears normal on long and transverse views. Significant increase in Doppler flow Subscapularis:  Appears normal on long and transverse views. Positive bursa Teres Minor:  Appears normal on long and transverse views. AC joint:  Capsule undistended, no geyser sign. Glenohumeral Joint:  Appears normal without effusion. Glenoid Labrum:  Intact without visualized tears. Biceps Tendon:  Appears normal on long and transverse views, no fraying of tendon, tendon located in intertubercular groove, no subluxation with shoulder internal or external rotation.  Impression: Subacromial bursitis   Procedure note 97110; 15 minutes spent for Therapeutic exercises as stated in above notes.  This included exercises focusing on stretching, strengthening, with significant focus on eccentric aspects.  Basic scapular stabilization to include adduction and depression of scapula Scaption, focusing on proper movement and good control Internal and External rotation utilizing a theraband, with elbow tucked at side entire time Rows with theraband Proper technique shown and discussed handout in great detail with ATC.  All questions were discussed and answered.     Impression and Recommendations:     This case required medical decision making of moderate  complexity.

## 2015-09-16 NOTE — Assessment & Plan Note (Signed)
Discussed with patient at length. Patient does not have any weakness in the ultrasound today no signs of a rotator cuff tear. Seems to be all subacromial bursitis. No signs of radicular symptoms. Patient given home exercises, prescription for topical anti-inflammatories, we discussed over-the-counter medications. Patient will try these different changes and come back and see me again in 3-4 weeks. If worsening symptoms we'll consider injection.

## 2015-09-16 NOTE — Patient Instructions (Signed)
Good to see you.  Ice 20 minutes 2 times daily. Usually after activity and before bed. Exercises 3 times a week.  pennsaid pinkie amount topically 2 times daily as needed.  Duexis 3 times daily for 3 days  Continue the turmeric at  twice daily Tart cherry extract at night Work on keeping shoulder blades back during the day  See me again in 4 weeks and if not better we will consider injection.

## 2015-09-16 NOTE — Progress Notes (Signed)
Pre visit review using our clinic review tool, if applicable. No additional management support is needed unless otherwise documented below in the visit note. 

## 2015-09-23 ENCOUNTER — Other Ambulatory Visit: Payer: Self-pay

## 2015-09-23 ENCOUNTER — Telehealth: Payer: Self-pay | Admitting: Family Medicine

## 2015-09-23 DIAGNOSIS — M25511 Pain in right shoulder: Secondary | ICD-10-CM

## 2015-09-23 DIAGNOSIS — M25512 Pain in left shoulder: Principal | ICD-10-CM

## 2015-09-23 MED ORDER — DICLOFENAC SODIUM 2 % TD SOLN: TRANSDERMAL | Status: AC

## 2015-09-23 NOTE — Telephone Encounter (Signed)
States she was suppose to get a script from Dr. Katrinka Blazing for Pennsaid but has not received it yet.

## 2015-09-25 ENCOUNTER — Ambulatory Visit: Payer: BLUE CROSS/BLUE SHIELD | Admitting: Internal Medicine

## 2015-10-09 ENCOUNTER — Ambulatory Visit: Payer: BLUE CROSS/BLUE SHIELD | Admitting: Family Medicine

## 2015-12-31 DIAGNOSIS — Z79899 Other long term (current) drug therapy: Secondary | ICD-10-CM | POA: Diagnosis not present

## 2015-12-31 DIAGNOSIS — M25511 Pain in right shoulder: Secondary | ICD-10-CM | POA: Diagnosis not present

## 2015-12-31 DIAGNOSIS — E785 Hyperlipidemia, unspecified: Secondary | ICD-10-CM | POA: Diagnosis not present

## 2016-01-02 DIAGNOSIS — Z79899 Other long term (current) drug therapy: Secondary | ICD-10-CM | POA: Diagnosis not present

## 2016-01-02 DIAGNOSIS — E039 Hypothyroidism, unspecified: Secondary | ICD-10-CM | POA: Diagnosis not present

## 2016-01-02 DIAGNOSIS — E785 Hyperlipidemia, unspecified: Secondary | ICD-10-CM | POA: Diagnosis not present

## 2016-01-07 DIAGNOSIS — M542 Cervicalgia: Secondary | ICD-10-CM | POA: Diagnosis not present

## 2016-01-12 DIAGNOSIS — M47892 Other spondylosis, cervical region: Secondary | ICD-10-CM | POA: Diagnosis not present

## 2016-01-12 DIAGNOSIS — M542 Cervicalgia: Secondary | ICD-10-CM | POA: Diagnosis not present

## 2016-01-14 DIAGNOSIS — M542 Cervicalgia: Secondary | ICD-10-CM | POA: Diagnosis not present

## 2016-01-14 DIAGNOSIS — M47892 Other spondylosis, cervical region: Secondary | ICD-10-CM | POA: Diagnosis not present

## 2016-01-22 DIAGNOSIS — M47892 Other spondylosis, cervical region: Secondary | ICD-10-CM | POA: Diagnosis not present

## 2016-01-22 DIAGNOSIS — M542 Cervicalgia: Secondary | ICD-10-CM | POA: Diagnosis not present

## 2016-01-29 DIAGNOSIS — M47892 Other spondylosis, cervical region: Secondary | ICD-10-CM | POA: Diagnosis not present

## 2016-01-29 DIAGNOSIS — M542 Cervicalgia: Secondary | ICD-10-CM | POA: Diagnosis not present

## 2016-02-02 DIAGNOSIS — M47892 Other spondylosis, cervical region: Secondary | ICD-10-CM | POA: Diagnosis not present

## 2016-02-02 DIAGNOSIS — M542 Cervicalgia: Secondary | ICD-10-CM | POA: Diagnosis not present

## 2016-02-06 DIAGNOSIS — M47892 Other spondylosis, cervical region: Secondary | ICD-10-CM | POA: Diagnosis not present

## 2016-02-06 DIAGNOSIS — M542 Cervicalgia: Secondary | ICD-10-CM | POA: Diagnosis not present

## 2016-02-09 DIAGNOSIS — M542 Cervicalgia: Secondary | ICD-10-CM | POA: Diagnosis not present

## 2016-02-09 DIAGNOSIS — M47892 Other spondylosis, cervical region: Secondary | ICD-10-CM | POA: Diagnosis not present

## 2016-02-12 DIAGNOSIS — M542 Cervicalgia: Secondary | ICD-10-CM | POA: Diagnosis not present

## 2016-02-12 DIAGNOSIS — M47892 Other spondylosis, cervical region: Secondary | ICD-10-CM | POA: Diagnosis not present

## 2016-06-07 ENCOUNTER — Encounter: Payer: Self-pay | Admitting: Cardiovascular Disease

## 2016-06-07 ENCOUNTER — Encounter (INDEPENDENT_AMBULATORY_CARE_PROVIDER_SITE_OTHER): Payer: Self-pay

## 2016-06-07 ENCOUNTER — Ambulatory Visit (INDEPENDENT_AMBULATORY_CARE_PROVIDER_SITE_OTHER): Payer: BLUE CROSS/BLUE SHIELD | Admitting: Cardiovascular Disease

## 2016-06-07 VITALS — BP 110/74 | HR 56 | Ht 63.5 in | Wt 121.4 lb

## 2016-06-07 DIAGNOSIS — E785 Hyperlipidemia, unspecified: Secondary | ICD-10-CM | POA: Diagnosis not present

## 2016-06-07 DIAGNOSIS — R002 Palpitations: Secondary | ICD-10-CM | POA: Diagnosis not present

## 2016-06-07 DIAGNOSIS — Z23 Encounter for immunization: Secondary | ICD-10-CM

## 2016-06-07 DIAGNOSIS — I471 Supraventricular tachycardia: Secondary | ICD-10-CM | POA: Diagnosis not present

## 2016-06-07 LAB — HEPATIC FUNCTION PANEL
ALK PHOS: 69 U/L (ref 33–130)
ALT: 14 U/L (ref 6–29)
AST: 18 U/L (ref 10–35)
Albumin: 4.3 g/dL (ref 3.6–5.1)
BILIRUBIN DIRECT: 0.1 mg/dL (ref <=0.2)
BILIRUBIN TOTAL: 0.4 mg/dL (ref 0.2–1.2)
Indirect Bilirubin: 0.3 mg/dL (ref 0.2–1.2)
Total Protein: 6.7 g/dL (ref 6.1–8.1)

## 2016-06-07 LAB — BASIC METABOLIC PANEL
BUN: 20 mg/dL (ref 7–25)
CALCIUM: 9.8 mg/dL (ref 8.6–10.4)
CO2: 31 mmol/L (ref 20–31)
CREATININE: 0.85 mg/dL (ref 0.50–1.05)
Chloride: 102 mmol/L (ref 98–110)
GLUCOSE: 82 mg/dL (ref 65–99)
Potassium: 4.4 mmol/L (ref 3.5–5.3)
Sodium: 139 mmol/L (ref 135–146)

## 2016-06-07 LAB — LIPID PANEL
CHOL/HDL RATIO: 2.3 ratio (ref <5.0)
CHOLESTEROL: 188 mg/dL (ref <200)
HDL: 82 mg/dL (ref >50)
LDL Cholesterol: 84 mg/dL (ref <100)
Triglycerides: 112 mg/dL (ref <150)
VLDL: 22 mg/dL (ref <30)

## 2016-06-07 LAB — TSH: TSH: 7.09 mIU/L — ABNORMAL HIGH

## 2016-06-07 NOTE — Patient Instructions (Signed)
Your physician recommends that you continue on your current medications as directed. Please refer to the Current Medication list given to you today.  Your physician recommends that you return for lab work in: today (BMET, LIPIDS, LIVER FUNCTION, TSH)  Your physician wants you to follow-up in: 1 YEAR WITH DR. Elease HashimotoNAHSER.  You will receive a reminder letter in the mail two months in advance. If you don't receive a letter, please call our office to schedule the follow-up appointment.

## 2016-06-07 NOTE — Progress Notes (Signed)
Adrienne FrohlichBeverly A Chambers Date of Birth  [birth year]       [facility]    [place] [facility] [address]  [address] [place], [place]  [address]   [place], [place]  [address] [phone]     [phone]   Fax  [phone]    Fax [phone]  Problem List: 1. Premature atrial contractions 2. Hypothyroidism   History of Present Illness:  Adrienne Chambers is doing well.   He has occasional palpitations.  She is still exercising some.  No chest pain. No dyspnea.  Dec. 8, 2014;  Adrienne Chambers is seen again for follow up of her palpitations.  She has had palpitations for years - father died of complications of A-Fib.   These palpitations are fairly rare - may occur while she is playing tennis.   The arrhythmia is usually a rapid, regular - last 15 seconds to 2 minutes.   These palpitations do not prevent her from doing her normal activities.   These episodes almost always occur in the summer. She's had several episodes while playing tennis out in the hot sun.    November 21, 2013:  Adrienne Chambers has been wearing a patch monitor. Her son have episodes of supraventricular tachycardia.    Nov. 7, 2016:  Adrienne Chambers is doing ok Has had some dizziness while playing tennis. At other times, she feels well.   Dizziness lasts for about 10 minutes.   No significant palpitations .   Has resolved since her mother passed away .  Has had documented SVT lasting for about a minute.   Nov. 13, 2017:  [name] Has rare episodes of palpitations.   Typically after working out .  Still playing tennis  Lots of stress,  Husband Adrienne Chambers is selling his [employer] business.     Current Outpatient Prescriptions on File Prior to Visit  Medication Sig Dispense Refill  . aspirin 81 MG tablet Take 81 mg by mouth daily. Take one tab daily as needed    . Calcium Carbonate (CALTRATE 600 PO) Take 600 mg by mouth daily.    . Coenzyme Q10-Red Yeast Rice 60-600 MG CAPS Take 1 capsule by mouth daily.      . Diclofenac Sodium 2 % SOLN Apply twice daily to affected area 1 Bottle 2  . levothyroxine (SYNTHROID, LEVOTHROID) 88 MCG tablet Take 88 mcg by mouth daily.    . Turmeric 500 MG CAPS Take 1 capsule by mouth daily.     No current facility-administered medications on file prior to visit.    she takes her Synthroid 88 mcg alternating with 75 mcg every other day.  Allergies  Allergen Reactions  . Sulfa Antibiotics     Past Medical History:  Diagnosis Date  . Abnormal CT scan, lung   . Hypercholesterolemia   . Hyperlipidemia   . Hypothyroidism   . Impaired fasting glucose   . Osteopenia   . Supraventricular tachycardia (HCC)   . Tachycardia     History reviewed. No pertinent surgical history.  History  Smoking Status  . Former Smoker  . Quit date: 07/26/1981  Smokeless Tobacco  . Never Used    History  Alcohol Use  . 1.0 oz/week  . 2 drink(s) per week    Family History  Problem Relation Age of Onset  . Heart disease Mother     valve repair  . Coronary artery disease Mother     CABG  . Heart murmur Mother   .  Arrhythmia Father   . Heart attack Father   . Atrial fibrillation Father   . Heart disease Father     Reviw of Systems:  Reviewed in the HPI.  All other systems are negative.  Physical Exam: Blood pressure 110/74, pulse (!) 56, height 5' 3.5" (1.613 m), weight 121 lb 6.4 oz (55.1 kg), SpO2 92 %. General: Well developed, well nourished, in no acute distress.  Head: Normocephalic, atraumatic, sclera non-icteric, mucus membranes are moist,   Neck: Supple. Carotids are 2 + without bruits. No JVD   Lungs: Clear   Heart: RR with occasional premature beats, normal S1, S2  Abdomen: Soft, non-tender, non-distended with normal bowel sounds.  Msk:  Strength and tone are normal   Extremities: No clubbing or cyanosis. No edema.  Distal pedal pulses are 2+ and equal    Neuro: CN II - XII intact.  Alert and oriented X 3.   Psych:  Normal   ECG: Nov.  13, 2017:   Sinus brady at 54.   Otherwise normal .    Assessment / Plan:     1. Supraventricular tachycardia: She's continues to have symptoms of palpitations that last about 5 minutes. These have been rare frequently . Will continue to watch . Briefly discussed the Kardia Monitor - she does not want to do at this point      2. Family history of coronary artery disease: her father died at age 58 of sudden cardiac death in her mother had a history of coronary artery bypass grafting in her 10760s. Adrienne Chambers has a history of moderate hyperlipidemia.   Her coronary calcium score is 1 Will check lipids today    3. Hyperlipidemia: She's currently watching her diet and she also is taking Red Yeast Rice.     I'll see her again in one year.  Kristeen MissPhilip Dilraj Killgore, MD  06/07/2016 11:38 AM    [facility] [address],  [address] [place], [place]  [address] Pager [phone] Phone: [phone]; Fax: [phone]

## 2016-06-22 ENCOUNTER — Other Ambulatory Visit: Payer: Self-pay | Admitting: Obstetrics and Gynecology

## 2016-06-22 DIAGNOSIS — Z1231 Encounter for screening mammogram for malignant neoplasm of breast: Secondary | ICD-10-CM

## 2016-06-23 ENCOUNTER — Ambulatory Visit
Admission: RE | Admit: 2016-06-23 | Discharge: 2016-06-23 | Disposition: A | Discharge status: Home / Self Care | Payer: BLUE CROSS/BLUE SHIELD | Source: Ambulatory Visit | Attending: Obstetrics and Gynecology | Admitting: Obstetrics and Gynecology

## 2016-06-23 ENCOUNTER — Ambulatory Visit: Payer: BLUE CROSS/BLUE SHIELD

## 2016-06-23 DIAGNOSIS — Z1231 Encounter for screening mammogram for malignant neoplasm of breast: Secondary | ICD-10-CM

## 2016-07-08 DIAGNOSIS — N390 Urinary tract infection, site not specified: Secondary | ICD-10-CM | POA: Diagnosis not present

## 2016-07-08 DIAGNOSIS — Z01419 Encounter for gynecological examination (general) (routine) without abnormal findings: Secondary | ICD-10-CM | POA: Diagnosis not present

## 2016-07-08 DIAGNOSIS — Z6821 Body mass index (BMI) 21.0-21.9, adult: Secondary | ICD-10-CM | POA: Diagnosis not present

## 2016-07-08 DIAGNOSIS — N952 Postmenopausal atrophic vaginitis: Secondary | ICD-10-CM | POA: Diagnosis not present

## 2016-08-05 DIAGNOSIS — H9201 Otalgia, right ear: Secondary | ICD-10-CM | POA: Diagnosis not present

## 2016-08-05 DIAGNOSIS — E039 Hypothyroidism, unspecified: Secondary | ICD-10-CM | POA: Diagnosis not present

## 2016-08-15 IMAGING — CT CT HEART SCORING
1 of 3 series · 10 of 20 positions shown, 13 images · non-contrast
Comparison: None.

CLINICAL DATA: Risk stratification

EXAM:
Coronary Calcium Score
TECHNIQUE: The patient was scanned on a Siemens Sensation 16 slice scanner.
Axial non-contrast 3mm slices were carried out through the heart.
The data set was analyzed on a dedicated work station and scored
using the Agatson method.

[Series 6: st thins for reformat · axial · 0.60mm/px · z∈[-208,-96]mm · 10 of 138 slices shown, 13 images]
[im 13/138  vessel]
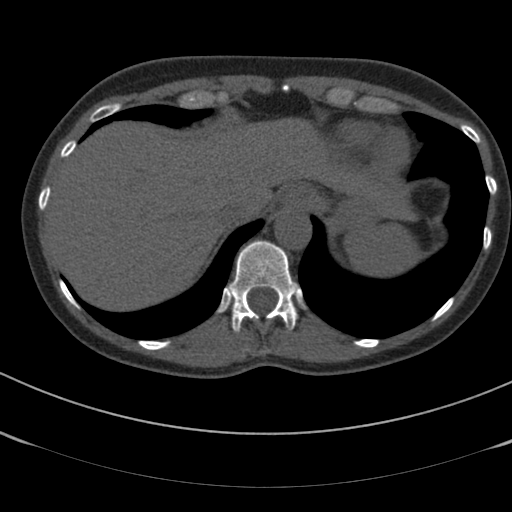
[im 13/138  lung]
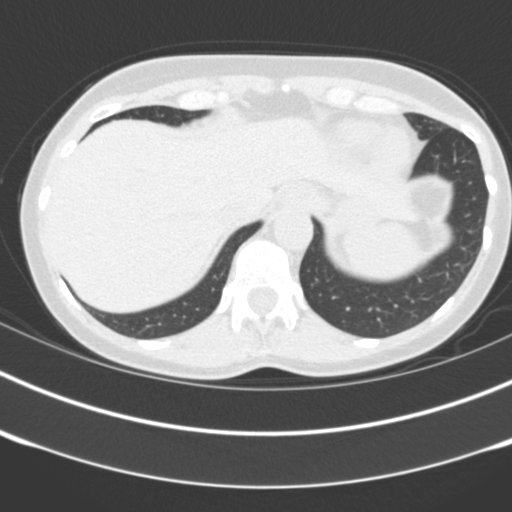
[im 25/138  vessel]
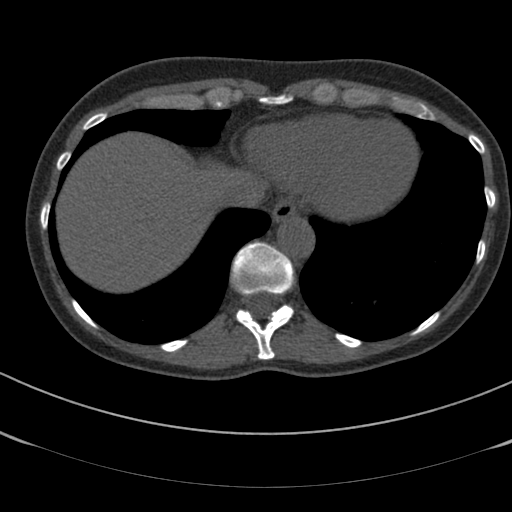
[im 38/138  vessel]
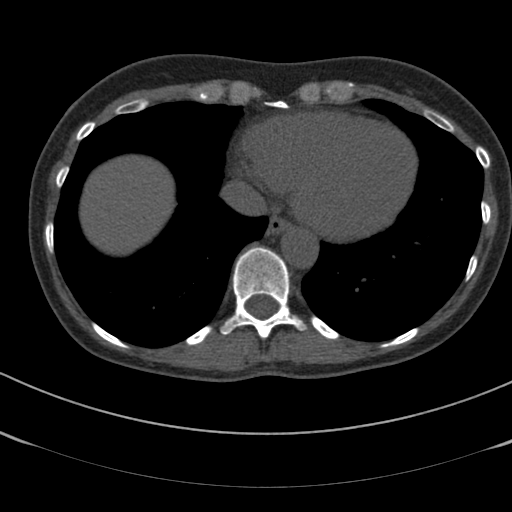
[im 50/138  vessel]
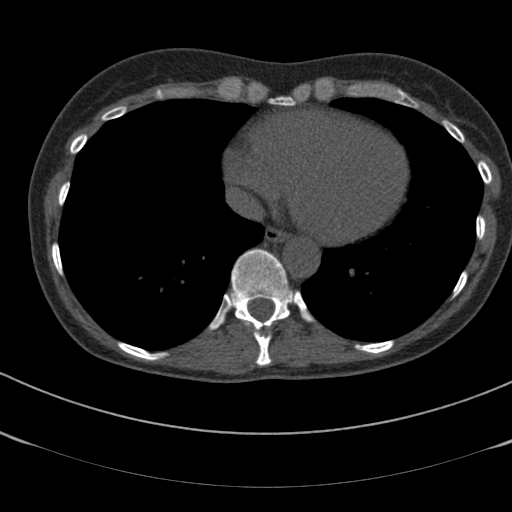
[im 63/138  vessel]
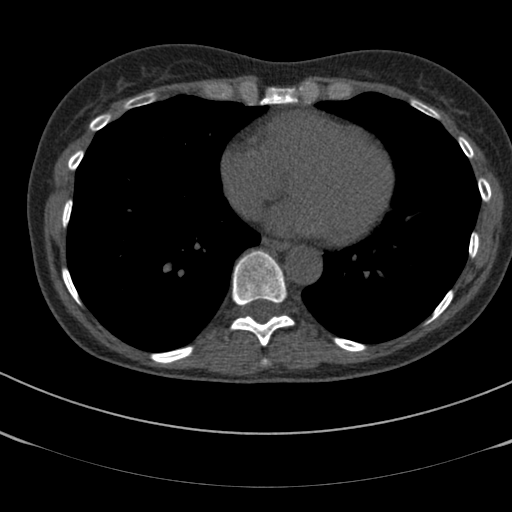
[im 63/138  lung]
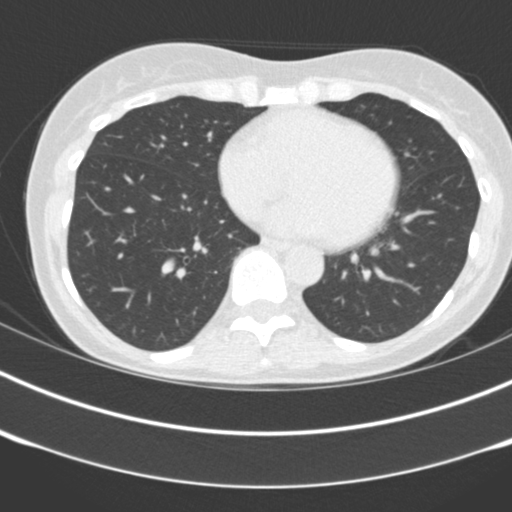
[im 75/138  vessel]
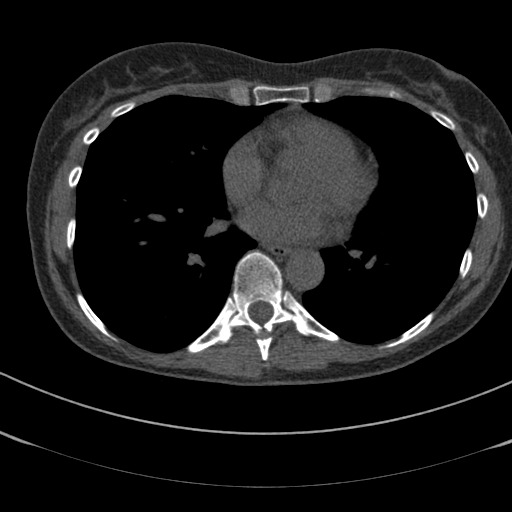
[im 88/138  vessel]
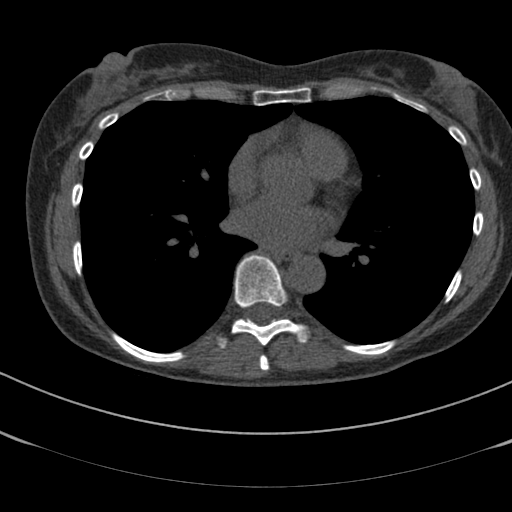
[im 100/138  vessel]
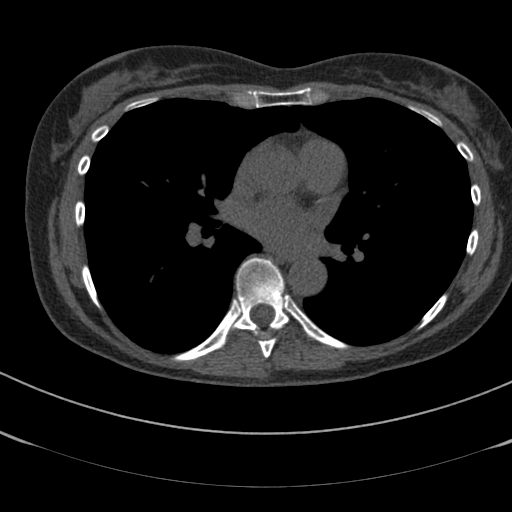
[im 113/138  vessel]
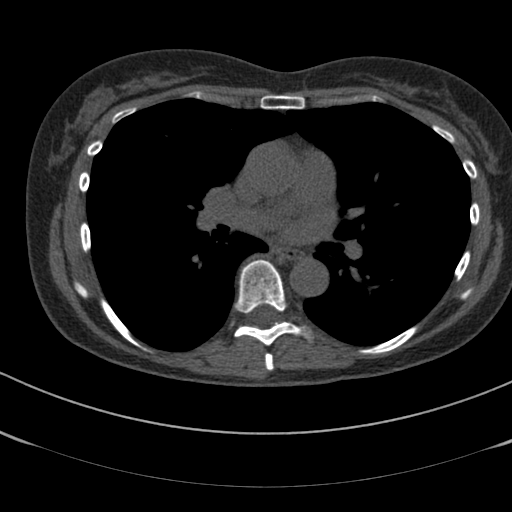
[im 113/138  lung]
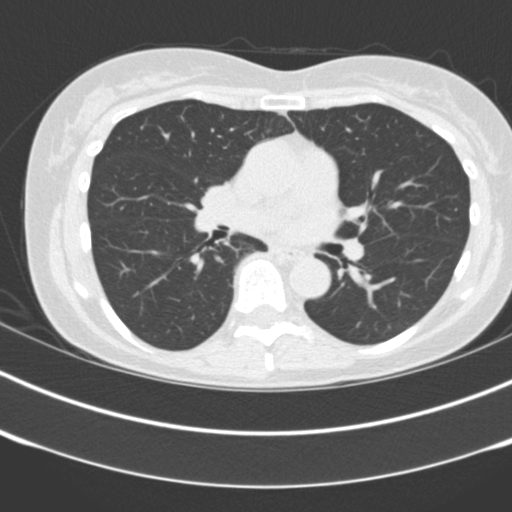
[im 125/138  vessel]
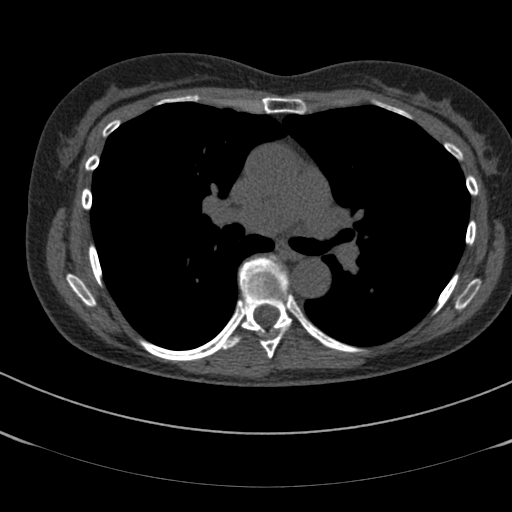

[10 of 20 positions shown; findings below may reference images not displayed]

FINDINGS: Non-cardiac:  See separate report from [REDACTED].

Ascending Aorta:  3.2 cm

Pericardium: Normal

Coronary arteries: Small amount of punctate calcium in mid LAD and
Diagonal
IMPRESSION: Coronary calcium score of 1. This was 62nd percentile for age and
sex matched control.

See Radiology report regarding LLL lung nodule

[place] Joshjax

EXAM:
OVER-READ INTERPRETATION  CT CHEST

The following report is an over-read performed by radiologist Dr.
does not include interpretation of cardiac or coronary anatomy or
pathology. The coronary calcium score interpretation by the
cardiologist is attached.
FINDINGS: Subpleural pulmonary nodule seen in the left lower lobe measuring 5
mm on image 17/series 4. No other nodules seen in visualized
portions of lung fields. No other noncardiac abnormality seen within
visualized portion of thorax.
IMPRESSION: Indeterminate 5 mm left lower lobe pulmonary nodule. If the patient
is at high risk for bronchogenic carcinoma, follow-up chest CT at
6-12 months is recommended. If the patient is at low risk for
bronchogenic carcinoma, follow-up chest CT at 12 months is
recommended. This recommendation follows the consensus statement:
Guidelines for Management of Small Pulmonary Nodules Detected on CT
Scans: A Statement from the [HOSPITAL] as published in

These results will be called to the ordering clinician or
representative by the Radiologist Assistant, and communication
documented in the PACS or zVision Dashboard.

## 2016-10-13 DIAGNOSIS — E039 Hypothyroidism, unspecified: Secondary | ICD-10-CM | POA: Diagnosis not present

## 2016-10-14 DIAGNOSIS — R319 Hematuria, unspecified: Secondary | ICD-10-CM | POA: Diagnosis not present

## 2016-11-10 DIAGNOSIS — R3121 Asymptomatic microscopic hematuria: Secondary | ICD-10-CM | POA: Diagnosis not present

## 2016-11-10 DIAGNOSIS — R351 Nocturia: Secondary | ICD-10-CM | POA: Diagnosis not present

## 2016-11-26 DIAGNOSIS — E039 Hypothyroidism, unspecified: Secondary | ICD-10-CM | POA: Diagnosis not present

## 2016-12-01 DIAGNOSIS — R3121 Asymptomatic microscopic hematuria: Secondary | ICD-10-CM | POA: Diagnosis not present

## 2016-12-01 DIAGNOSIS — E039 Hypothyroidism, unspecified: Secondary | ICD-10-CM | POA: Diagnosis not present

## 2016-12-01 DIAGNOSIS — R3129 Other microscopic hematuria: Secondary | ICD-10-CM | POA: Diagnosis not present

## 2016-12-01 DIAGNOSIS — J40 Bronchitis, not specified as acute or chronic: Secondary | ICD-10-CM | POA: Diagnosis not present

## 2017-02-08 DIAGNOSIS — H25813 Combined forms of age-related cataract, bilateral: Secondary | ICD-10-CM | POA: Diagnosis not present

## 2017-02-08 DIAGNOSIS — E039 Hypothyroidism, unspecified: Secondary | ICD-10-CM | POA: Diagnosis not present

## 2017-04-25 DIAGNOSIS — E782 Mixed hyperlipidemia: Secondary | ICD-10-CM | POA: Diagnosis not present

## 2017-04-25 DIAGNOSIS — Z23 Encounter for immunization: Secondary | ICD-10-CM | POA: Diagnosis not present

## 2017-04-25 DIAGNOSIS — E039 Hypothyroidism, unspecified: Secondary | ICD-10-CM | POA: Diagnosis not present

## 2017-04-25 DIAGNOSIS — N951 Menopausal and female climacteric states: Secondary | ICD-10-CM | POA: Diagnosis not present

## 2017-04-25 DIAGNOSIS — I471 Supraventricular tachycardia: Secondary | ICD-10-CM | POA: Diagnosis not present

## 2017-04-25 DIAGNOSIS — E559 Vitamin D deficiency, unspecified: Secondary | ICD-10-CM | POA: Diagnosis not present

## 2017-07-29 DIAGNOSIS — Z0000 Encounter for general adult medical examination without abnormal findings: Secondary | ICD-10-CM | POA: Diagnosis not present

## 2017-07-29 DIAGNOSIS — Z114 Encounter for screening for human immunodeficiency virus [HIV]: Secondary | ICD-10-CM | POA: Diagnosis not present

## 2017-07-29 DIAGNOSIS — Z13220 Encounter for screening for lipoid disorders: Secondary | ICD-10-CM | POA: Diagnosis not present

## 2017-07-29 DIAGNOSIS — Z1329 Encounter for screening for other suspected endocrine disorder: Secondary | ICD-10-CM | POA: Diagnosis not present

## 2017-07-29 DIAGNOSIS — E559 Vitamin D deficiency, unspecified: Secondary | ICD-10-CM | POA: Diagnosis not present

## 2017-09-02 DIAGNOSIS — R319 Hematuria, unspecified: Secondary | ICD-10-CM | POA: Diagnosis not present

## 2017-09-02 DIAGNOSIS — N390 Urinary tract infection, site not specified: Secondary | ICD-10-CM | POA: Diagnosis not present

## 2017-09-02 DIAGNOSIS — R300 Dysuria: Secondary | ICD-10-CM | POA: Diagnosis not present

## 2017-09-05 ENCOUNTER — Other Ambulatory Visit: Payer: Self-pay | Admitting: Family Medicine

## 2017-09-05 DIAGNOSIS — Z0000 Encounter for general adult medical examination without abnormal findings: Secondary | ICD-10-CM | POA: Diagnosis not present

## 2017-09-05 DIAGNOSIS — N952 Postmenopausal atrophic vaginitis: Secondary | ICD-10-CM | POA: Diagnosis not present

## 2017-09-05 DIAGNOSIS — Z1231 Encounter for screening mammogram for malignant neoplasm of breast: Secondary | ICD-10-CM

## 2017-09-05 DIAGNOSIS — E782 Mixed hyperlipidemia: Secondary | ICD-10-CM | POA: Diagnosis not present

## 2017-09-05 DIAGNOSIS — H6123 Impacted cerumen, bilateral: Secondary | ICD-10-CM | POA: Diagnosis not present

## 2017-09-05 DIAGNOSIS — Z23 Encounter for immunization: Secondary | ICD-10-CM | POA: Diagnosis not present

## 2017-09-05 DIAGNOSIS — Z1211 Encounter for screening for malignant neoplasm of colon: Secondary | ICD-10-CM | POA: Diagnosis not present

## 2017-09-05 DIAGNOSIS — E559 Vitamin D deficiency, unspecified: Secondary | ICD-10-CM | POA: Diagnosis not present

## 2017-09-05 DIAGNOSIS — E039 Hypothyroidism, unspecified: Secondary | ICD-10-CM | POA: Diagnosis not present

## 2017-09-05 DIAGNOSIS — Z6822 Body mass index (BMI) 22.0-22.9, adult: Secondary | ICD-10-CM | POA: Diagnosis not present

## 2017-09-10 DIAGNOSIS — N390 Urinary tract infection, site not specified: Secondary | ICD-10-CM | POA: Diagnosis not present

## 2017-09-26 ENCOUNTER — Ambulatory Visit: Payer: BLUE CROSS/BLUE SHIELD

## 2017-10-25 ENCOUNTER — Encounter: Payer: Self-pay | Admitting: Family Medicine

## 2017-11-04 DIAGNOSIS — E039 Hypothyroidism, unspecified: Secondary | ICD-10-CM | POA: Diagnosis not present

## 2017-11-09 DIAGNOSIS — N951 Menopausal and female climacteric states: Secondary | ICD-10-CM | POA: Diagnosis not present

## 2017-11-09 DIAGNOSIS — E039 Hypothyroidism, unspecified: Secondary | ICD-10-CM | POA: Diagnosis not present

## 2017-11-09 DIAGNOSIS — N952 Postmenopausal atrophic vaginitis: Secondary | ICD-10-CM | POA: Diagnosis not present

## 2017-11-09 DIAGNOSIS — I471 Supraventricular tachycardia: Secondary | ICD-10-CM | POA: Diagnosis not present

## 2017-11-21 DIAGNOSIS — Z6821 Body mass index (BMI) 21.0-21.9, adult: Secondary | ICD-10-CM | POA: Diagnosis not present

## 2017-11-21 DIAGNOSIS — K921 Melena: Secondary | ICD-10-CM | POA: Diagnosis not present

## 2017-11-21 DIAGNOSIS — R319 Hematuria, unspecified: Secondary | ICD-10-CM | POA: Diagnosis not present

## 2017-11-21 DIAGNOSIS — K649 Unspecified hemorrhoids: Secondary | ICD-10-CM | POA: Diagnosis not present

## 2018-04-06 DIAGNOSIS — E559 Vitamin D deficiency, unspecified: Secondary | ICD-10-CM | POA: Diagnosis not present

## 2018-04-06 DIAGNOSIS — E039 Hypothyroidism, unspecified: Secondary | ICD-10-CM | POA: Diagnosis not present

## 2018-04-11 DIAGNOSIS — M7701 Medial epicondylitis, right elbow: Secondary | ICD-10-CM | POA: Diagnosis not present

## 2018-04-11 DIAGNOSIS — Z23 Encounter for immunization: Secondary | ICD-10-CM | POA: Diagnosis not present

## 2018-04-11 DIAGNOSIS — E039 Hypothyroidism, unspecified: Secondary | ICD-10-CM | POA: Diagnosis not present

## 2018-04-11 DIAGNOSIS — N951 Menopausal and female climacteric states: Secondary | ICD-10-CM | POA: Diagnosis not present

## 2018-04-11 DIAGNOSIS — N952 Postmenopausal atrophic vaginitis: Secondary | ICD-10-CM | POA: Diagnosis not present

## 2018-06-20 DIAGNOSIS — T1512XA Foreign body in conjunctival sac, left eye, initial encounter: Secondary | ICD-10-CM | POA: Diagnosis not present

## 2018-08-02 DIAGNOSIS — J329 Chronic sinusitis, unspecified: Secondary | ICD-10-CM | POA: Diagnosis not present

## 2018-08-02 DIAGNOSIS — J069 Acute upper respiratory infection, unspecified: Secondary | ICD-10-CM | POA: Diagnosis not present

## 2018-08-02 DIAGNOSIS — Z6821 Body mass index (BMI) 21.0-21.9, adult: Secondary | ICD-10-CM | POA: Diagnosis not present

## 2018-08-02 DIAGNOSIS — H6123 Impacted cerumen, bilateral: Secondary | ICD-10-CM | POA: Diagnosis not present

## 2018-08-17 DIAGNOSIS — L299 Pruritus, unspecified: Secondary | ICD-10-CM | POA: Diagnosis not present

## 2018-09-15 DIAGNOSIS — J069 Acute upper respiratory infection, unspecified: Secondary | ICD-10-CM | POA: Diagnosis not present

## 2018-09-15 DIAGNOSIS — N390 Urinary tract infection, site not specified: Secondary | ICD-10-CM | POA: Diagnosis not present

## 2018-12-26 DIAGNOSIS — Z719 Counseling, unspecified: Secondary | ICD-10-CM | POA: Diagnosis not present

## 2018-12-26 DIAGNOSIS — M791 Myalgia, unspecified site: Secondary | ICD-10-CM | POA: Diagnosis not present

## 2018-12-26 DIAGNOSIS — Z0389 Encounter for observation for other suspected diseases and conditions ruled out: Secondary | ICD-10-CM | POA: Diagnosis not present

## 2018-12-28 DIAGNOSIS — M791 Myalgia, unspecified site: Secondary | ICD-10-CM | POA: Diagnosis not present

## 2019-03-02 DIAGNOSIS — M79674 Pain in right toe(s): Secondary | ICD-10-CM | POA: Diagnosis not present

## 2019-03-02 DIAGNOSIS — M79675 Pain in left toe(s): Secondary | ICD-10-CM | POA: Diagnosis not present

## 2019-03-02 DIAGNOSIS — M79671 Pain in right foot: Secondary | ICD-10-CM | POA: Diagnosis not present

## 2019-03-22 ENCOUNTER — Ambulatory Visit: Payer: BLUE CROSS/BLUE SHIELD | Admitting: Family Medicine

## 2019-08-06 DIAGNOSIS — E039 Hypothyroidism, unspecified: Secondary | ICD-10-CM | POA: Diagnosis not present

## 2019-08-08 DIAGNOSIS — E039 Hypothyroidism, unspecified: Secondary | ICD-10-CM | POA: Diagnosis not present

## 2019-10-01 DIAGNOSIS — E039 Hypothyroidism, unspecified: Secondary | ICD-10-CM | POA: Diagnosis not present

## 2019-10-02 DIAGNOSIS — N951 Menopausal and female climacteric states: Secondary | ICD-10-CM | POA: Diagnosis not present

## 2019-10-02 DIAGNOSIS — E559 Vitamin D deficiency, unspecified: Secondary | ICD-10-CM | POA: Diagnosis not present

## 2019-10-02 DIAGNOSIS — N952 Postmenopausal atrophic vaginitis: Secondary | ICD-10-CM | POA: Diagnosis not present

## 2019-10-02 DIAGNOSIS — E039 Hypothyroidism, unspecified: Secondary | ICD-10-CM | POA: Diagnosis not present

## 2019-12-05 ENCOUNTER — Other Ambulatory Visit: Payer: Self-pay | Admitting: Family Medicine

## 2019-12-05 DIAGNOSIS — Z1231 Encounter for screening mammogram for malignant neoplasm of breast: Secondary | ICD-10-CM

## 2019-12-10 ENCOUNTER — Other Ambulatory Visit: Payer: Self-pay

## 2019-12-10 ENCOUNTER — Ambulatory Visit
Admission: RE | Admit: 2019-12-10 | Discharge: 2019-12-10 | Disposition: A | Discharge status: Home / Self Care | Payer: BC Managed Care – PPO | Source: Ambulatory Visit | Attending: Family Medicine | Admitting: Family Medicine

## 2019-12-10 DIAGNOSIS — Z1231 Encounter for screening mammogram for malignant neoplasm of breast: Secondary | ICD-10-CM

## 2019-12-18 DIAGNOSIS — Z0000 Encounter for general adult medical examination without abnormal findings: Secondary | ICD-10-CM | POA: Diagnosis not present

## 2019-12-18 DIAGNOSIS — Z0184 Encounter for antibody response examination: Secondary | ICD-10-CM | POA: Diagnosis not present

## 2019-12-20 DIAGNOSIS — Z0000 Encounter for general adult medical examination without abnormal findings: Secondary | ICD-10-CM | POA: Diagnosis not present

## 2019-12-20 DIAGNOSIS — Z23 Encounter for immunization: Secondary | ICD-10-CM | POA: Diagnosis not present

## 2019-12-20 DIAGNOSIS — Z1211 Encounter for screening for malignant neoplasm of colon: Secondary | ICD-10-CM | POA: Diagnosis not present

## 2019-12-20 DIAGNOSIS — Z6821 Body mass index (BMI) 21.0-21.9, adult: Secondary | ICD-10-CM | POA: Diagnosis not present

## 2020-04-10 DIAGNOSIS — H2513 Age-related nuclear cataract, bilateral: Secondary | ICD-10-CM | POA: Diagnosis not present

## 2020-04-29 DIAGNOSIS — Z7185 Encounter for immunization safety counseling: Secondary | ICD-10-CM | POA: Diagnosis not present

## 2020-04-29 DIAGNOSIS — E039 Hypothyroidism, unspecified: Secondary | ICD-10-CM | POA: Diagnosis not present

## 2020-04-29 DIAGNOSIS — E559 Vitamin D deficiency, unspecified: Secondary | ICD-10-CM | POA: Diagnosis not present

## 2020-05-06 DIAGNOSIS — Z0289 Encounter for other administrative examinations: Secondary | ICD-10-CM | POA: Diagnosis not present

## 2020-05-06 DIAGNOSIS — Z20822 Contact with and (suspected) exposure to covid-19: Secondary | ICD-10-CM | POA: Diagnosis not present

## 2020-06-22 DIAGNOSIS — N3000 Acute cystitis without hematuria: Secondary | ICD-10-CM | POA: Diagnosis not present

## 2020-06-28 DIAGNOSIS — N3000 Acute cystitis without hematuria: Secondary | ICD-10-CM | POA: Diagnosis not present

## 2020-07-24 DIAGNOSIS — E039 Hypothyroidism, unspecified: Secondary | ICD-10-CM | POA: Diagnosis not present

## 2020-07-24 DIAGNOSIS — E559 Vitamin D deficiency, unspecified: Secondary | ICD-10-CM | POA: Diagnosis not present
